# Patient Record
Sex: Male | Born: 1970 | Race: White | Hispanic: No | Marital: Married | Smoking: Never smoker
Health system: Southern US, Community
[De-identification: ages and names within clinical notes are randomized; demographics above are authoritative.]

---

## 2009-03-27 ENCOUNTER — Encounter: Admission: RE | Admit: 2009-03-27 | Discharge: 2009-03-27 | Payer: Self-pay | Admitting: Family Medicine

## 2009-03-28 ENCOUNTER — Encounter: Admission: RE | Admit: 2009-03-28 | Discharge: 2009-03-28 | Payer: Self-pay | Admitting: Family Medicine

## 2009-04-01 ENCOUNTER — Ambulatory Visit (HOSPITAL_COMMUNITY): Admission: RE | Admit: 2009-04-01 | Discharge: 2009-04-01 | Payer: Self-pay | Admitting: Family Medicine

## 2009-05-15 ENCOUNTER — Encounter: Admission: RE | Admit: 2009-05-15 | Discharge: 2009-05-15 | Payer: Self-pay | Admitting: Family Medicine

## 2009-07-17 ENCOUNTER — Ambulatory Visit (HOSPITAL_COMMUNITY): Admission: RE | Admit: 2009-07-17 | Discharge: 2009-07-17 | Payer: Self-pay | Admitting: Surgery

## 2009-07-17 ENCOUNTER — Encounter (INDEPENDENT_AMBULATORY_CARE_PROVIDER_SITE_OTHER): Payer: Self-pay | Admitting: Surgery

## 2010-11-10 LAB — COMPREHENSIVE METABOLIC PANEL
AST: 70 U/L — ABNORMAL HIGH (ref 0–37)
BUN: 13 mg/dL (ref 6–23)
CO2: 31 mEq/L (ref 19–32)
Calcium: 9.4 mg/dL (ref 8.4–10.5)
Chloride: 104 mEq/L (ref 96–112)
GFR calc Af Amer: >60 mL/min (ref >60)
GFR calc non Af Amer: >60 mL/min (ref >60)
Glucose, Bld: 105 mg/dL — ABNORMAL HIGH (ref 70–99)
Sodium: 140 mEq/L (ref 135–145)
Total Bilirubin: 0.9 mg/dL (ref 0.3–1.2)

## 2010-11-10 LAB — CBC
HCT: 44.6 % (ref 39.0–52.0)
Hemoglobin: 14.8 g/dL (ref 13.0–17.0)
MCHC: 33.1 g/dL (ref 30.0–36.0)
MCV: 93.6 fL (ref 78.0–100.0)

## 2010-11-10 LAB — DIFFERENTIAL
Basophils Absolute: 0 10*3/uL (ref 0.0–0.1)
Eosinophils Absolute: 0 10*3/uL (ref 0.0–0.7)
Eosinophils Relative: 1 % (ref 0–5)
Lymphocytes Relative: 28 % (ref 12–46)
Lymphs Abs: 1.3 10*3/uL (ref 0.7–4.0)
Monocytes Absolute: 0.3 10*3/uL (ref 0.1–1.0)
Monocytes Relative: 6 % (ref 3–12)
Neutro Abs: 3.1 10*3/uL (ref 1.7–7.7)

## 2010-11-10 LAB — URINALYSIS, ROUTINE W REFLEX MICROSCOPIC
Hgb urine dipstick: NEGATIVE
Ketones, ur: NEGATIVE mg/dL

## 2010-11-10 LAB — PROTIME-INR
INR: 0.99 (ref 0.00–1.49)
Prothrombin Time: 13 seconds (ref 11.6–15.2)

## 2010-12-01 ENCOUNTER — Other Ambulatory Visit: Payer: Self-pay | Admitting: Gastroenterology

## 2010-12-01 DIAGNOSIS — R1011 Right upper quadrant pain: Secondary | ICD-10-CM

## 2010-12-02 ENCOUNTER — Ambulatory Visit
Admission: RE | Admit: 2010-12-02 | Discharge: 2010-12-02 | Disposition: A | Discharge status: Home / Self Care | Payer: BC Managed Care – PPO | Source: Ambulatory Visit | Attending: Gastroenterology | Admitting: Gastroenterology

## 2010-12-02 DIAGNOSIS — R1011 Right upper quadrant pain: Secondary | ICD-10-CM

## 2010-12-29 ENCOUNTER — Emergency Department (HOSPITAL_BASED_OUTPATIENT_CLINIC_OR_DEPARTMENT_OTHER)
Admission: EM | Admit: 2010-12-29 | Discharge: 2010-12-29 | Disposition: A | Discharge status: Home / Self Care | Payer: BC Managed Care – PPO | Attending: Emergency Medicine | Admitting: Emergency Medicine

## 2010-12-29 DIAGNOSIS — K219 Gastro-esophageal reflux disease without esophagitis: Secondary | ICD-10-CM | POA: Insufficient documentation

## 2010-12-29 DIAGNOSIS — F1910 Other psychoactive substance abuse, uncomplicated: Secondary | ICD-10-CM | POA: Insufficient documentation

## 2010-12-29 DIAGNOSIS — R197 Diarrhea, unspecified: Secondary | ICD-10-CM | POA: Insufficient documentation

## 2010-12-29 DIAGNOSIS — G8929 Other chronic pain: Secondary | ICD-10-CM | POA: Insufficient documentation

## 2010-12-29 DIAGNOSIS — R112 Nausea with vomiting, unspecified: Secondary | ICD-10-CM | POA: Insufficient documentation

## 2010-12-29 LAB — BASIC METABOLIC PANEL
Chloride: 101 mEq/L (ref 96–112)
GFR calc Af Amer: >60 mL/min (ref >60)

## 2011-02-12 ENCOUNTER — Other Ambulatory Visit: Payer: Self-pay | Admitting: Gastroenterology

## 2011-12-16 ENCOUNTER — Other Ambulatory Visit (HOSPITAL_BASED_OUTPATIENT_CLINIC_OR_DEPARTMENT_OTHER): Payer: Self-pay | Admitting: Family Medicine

## 2011-12-16 DIAGNOSIS — M5124 Other intervertebral disc displacement, thoracic region: Secondary | ICD-10-CM

## 2011-12-18 ENCOUNTER — Ambulatory Visit (HOSPITAL_BASED_OUTPATIENT_CLINIC_OR_DEPARTMENT_OTHER)
Admission: RE | Admit: 2011-12-18 | Discharge: 2011-12-18 | Disposition: A | Discharge status: Home / Self Care | Payer: BC Managed Care – PPO | Source: Ambulatory Visit | Attending: Family Medicine | Admitting: Family Medicine

## 2011-12-18 DIAGNOSIS — R109 Unspecified abdominal pain: Secondary | ICD-10-CM

## 2011-12-18 DIAGNOSIS — IMO0002 Reserved for concepts with insufficient information to code with codable children: Secondary | ICD-10-CM | POA: Insufficient documentation

## 2011-12-18 DIAGNOSIS — M5124 Other intervertebral disc displacement, thoracic region: Secondary | ICD-10-CM

## 2012-03-07 ENCOUNTER — Other Ambulatory Visit: Payer: Self-pay | Admitting: Family Medicine

## 2013-01-01 IMAGING — US US ABDOMEN COMPLETE
1 series · 14 of 25 positions shown · non-contrast
Comparison: March 27, 2009 and March 28, 2009

CLINICAL DATA: Right upper quadrant pain

ABDOMINAL ULTRASOUND COMPLETE

[Series 1: us abdomen complete · 0.17mm/px · 14 of 69 slices shown]
[im 1/69]
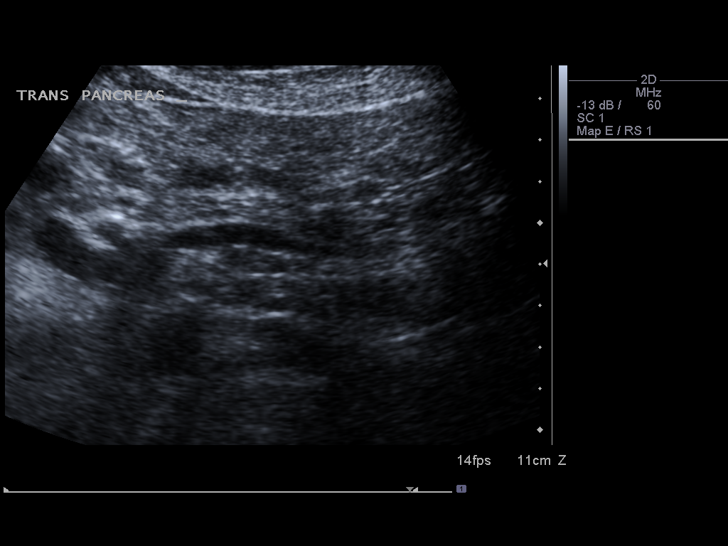
[im 6/69]
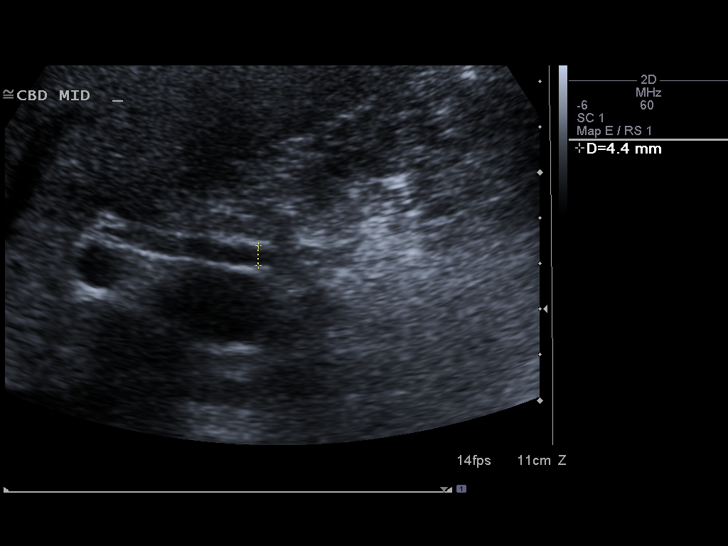
[im 12/69]
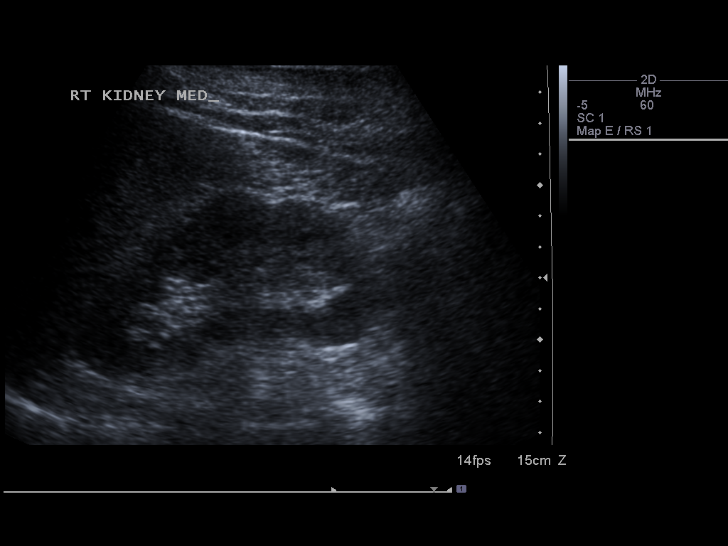
[im 18/69]
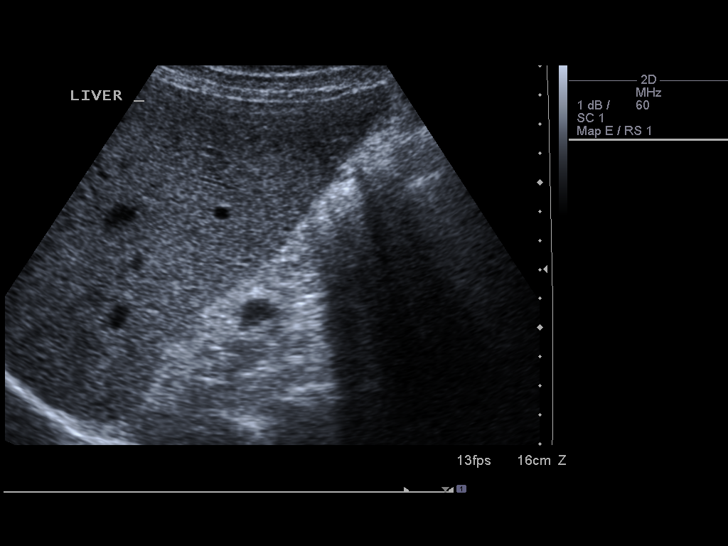
[im 23/69]
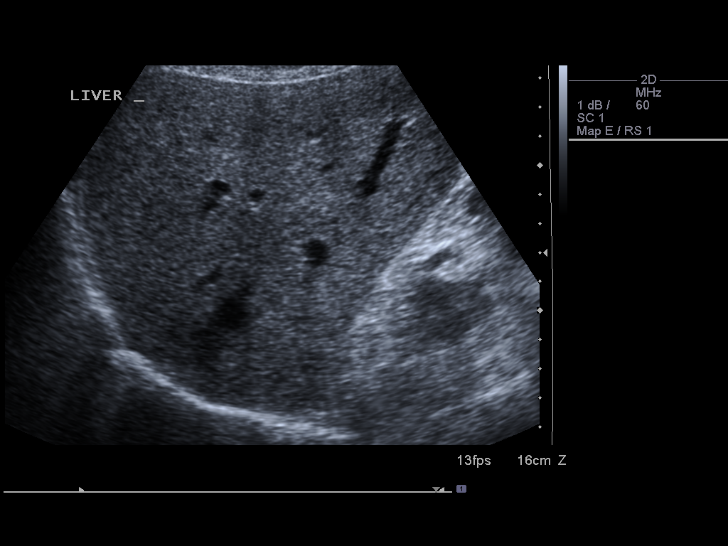
[im 26/69]
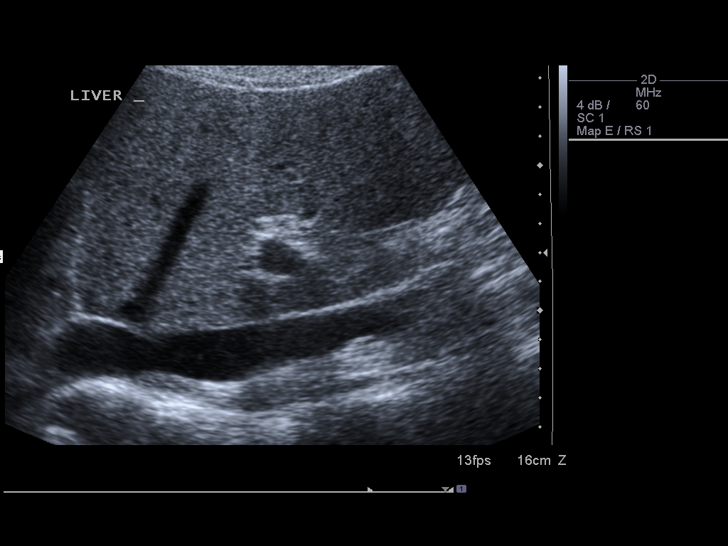
[im 32/69]
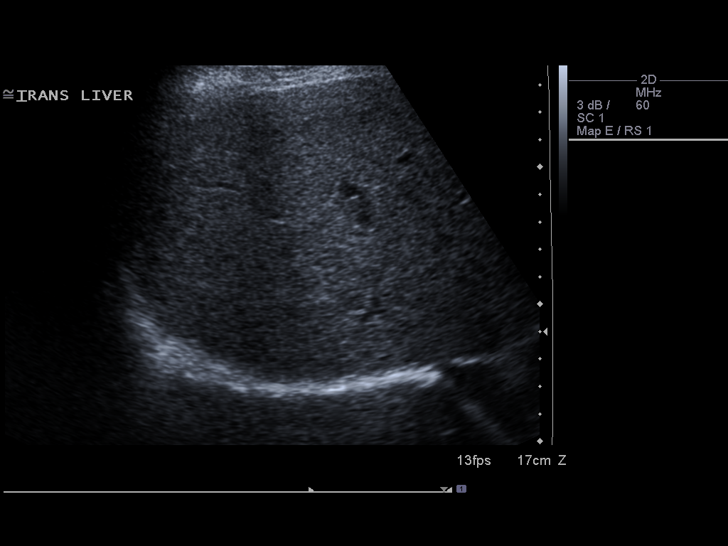
[im 37/69]
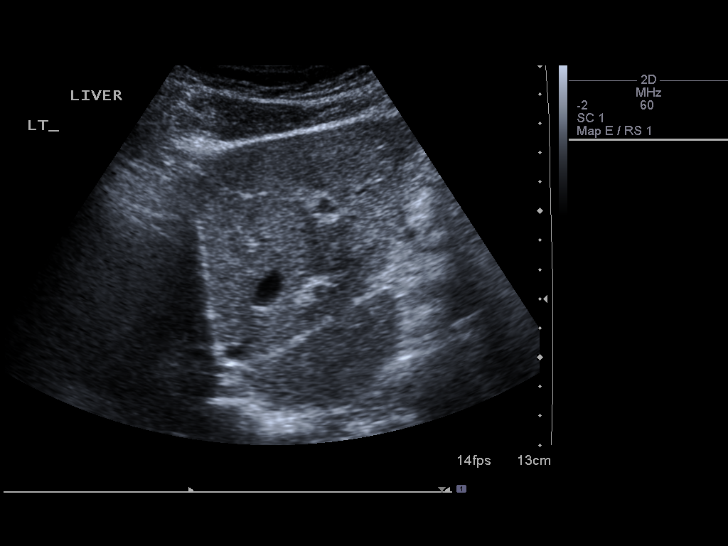
[im 43/69]
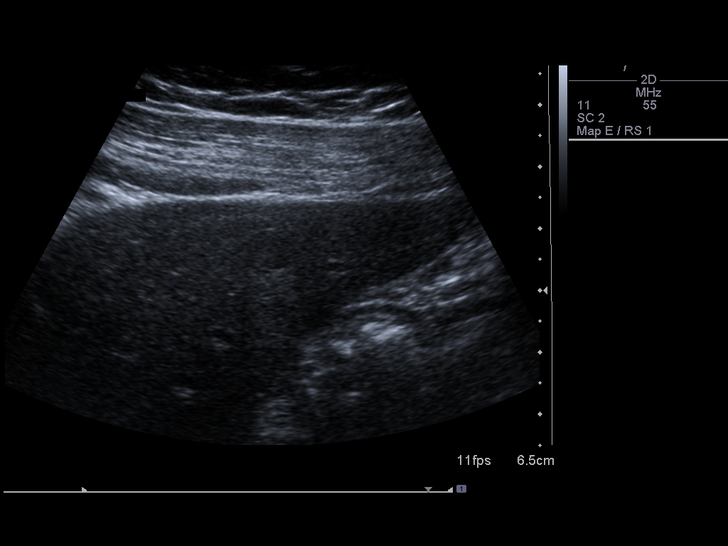
[im 46/69]
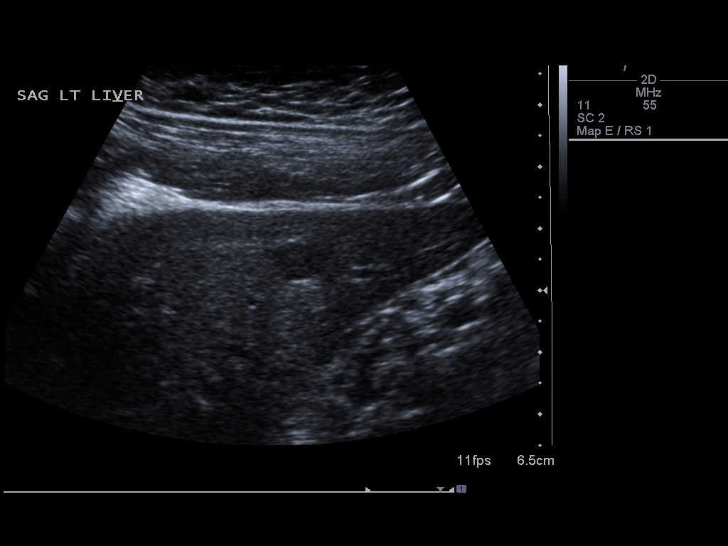
[im 52/69]
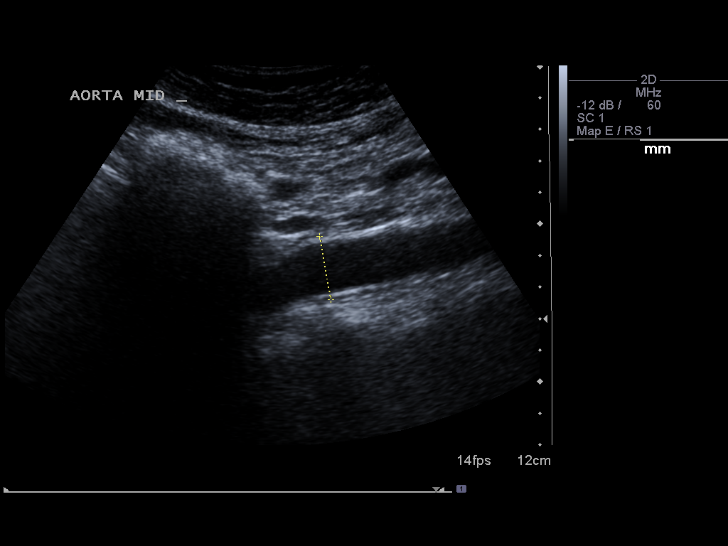
[im 57/69]
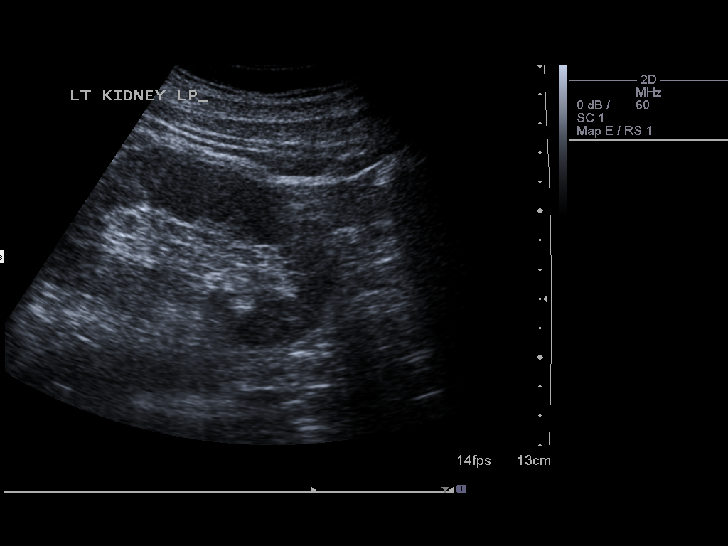
[im 63/69]
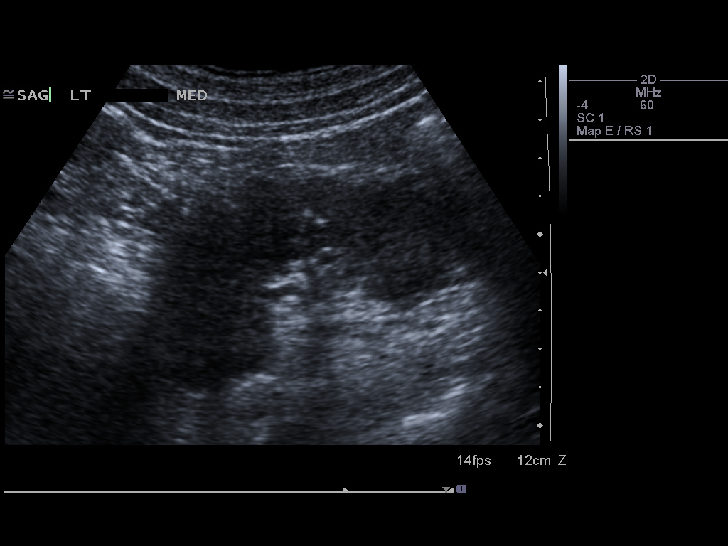
[im 69/69]
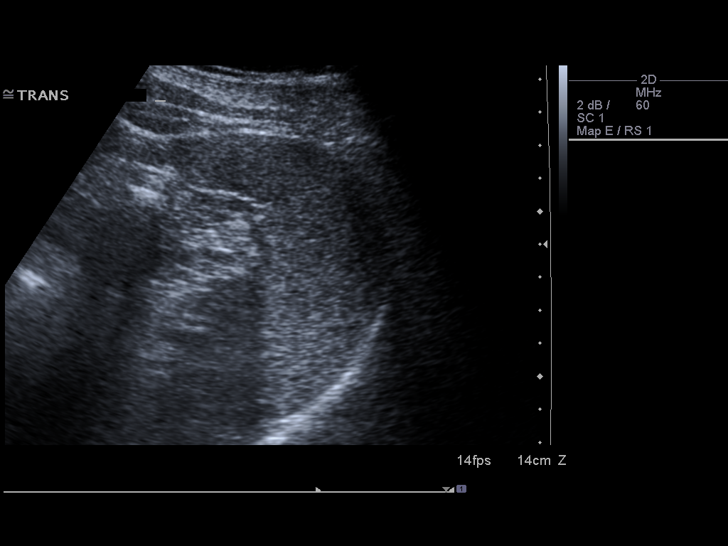

[14 of 25 positions shown; findings below may reference images not displayed]

FINDINGS: Gallbladder:  Surgically absent.

Common Bile Duct:  Within normal limits in caliber. Measures
mm.

Liver: No focal mass lesion identified. There is a stable 7 mm cyst
in the left lobe. Diffuse fatty infiltration is present.

IVC:  Appears normal.

Pancreas:  No abnormality identified.

Spleen:  Within normal limits in size and echotexture.

Right kidney:  Normal in size and parenchymal echogenicity.  No
evidence of mass or hydronephrosis.

Left kidney:  Normal in size and parenchymal echogenicity.  No
evidence of mass or hydronephrosis. There is a stable 1.8 cm
complex cyst at the mid pole.

Abdominal Aorta:  No aneurysm identified.
IMPRESSION: There is mild, diffuse fatty infiltration the liver.

There are stable hepatic and renal cysts.

## 2014-08-16 ENCOUNTER — Other Ambulatory Visit: Payer: Self-pay | Admitting: Gastroenterology

## 2017-10-18 ENCOUNTER — Other Ambulatory Visit: Payer: Self-pay | Admitting: Family

## 2017-10-18 DIAGNOSIS — D582 Other hemoglobinopathies: Secondary | ICD-10-CM

## 2017-10-19 ENCOUNTER — Other Ambulatory Visit: Payer: Self-pay

## 2017-10-19 ENCOUNTER — Inpatient Hospital Stay: Payer: Self-pay

## 2017-10-19 ENCOUNTER — Inpatient Hospital Stay: Payer: Self-pay | Attending: Family | Admitting: Family

## 2017-10-19 ENCOUNTER — Encounter: Payer: Self-pay | Admitting: Family

## 2017-10-19 VITALS — BP 116/71 | HR 81 | Temp 97.9°F | Resp 17 | Wt 223.0 lb

## 2017-10-19 DIAGNOSIS — D582 Other hemoglobinopathies: Secondary | ICD-10-CM | POA: Insufficient documentation

## 2017-10-19 LAB — CBC WITH DIFFERENTIAL (CANCER CENTER ONLY)
Basophils Absolute: 0 10*3/uL (ref 0.0–0.1)
Basophils Relative: 0 %
EOS ABS: 0.1 10*3/uL (ref 0.0–0.5)
EOS PCT: 1 %
HCT: 49.2 % (ref 38.7–49.9)
Hemoglobin: 17 g/dL (ref 13.0–17.1)
LYMPHS ABS: 1.6 10*3/uL (ref 0.9–3.3)
LYMPHS PCT: 25 %
MCH: 30 pg (ref 28.0–33.4)
MCHC: 34.6 g/dL (ref 32.0–35.9)
MCV: 86.8 fL (ref 82.0–98.0)
MONO ABS: 0.5 10*3/uL (ref 0.1–0.9)
MONOS PCT: 9 %
Neutro Abs: 4 10*3/uL (ref 1.5–6.5)
Neutrophils Relative %: 65 %
PLATELETS: 224 10*3/uL (ref 145–400)
RBC: 5.67 MIL/uL (ref 4.20–5.70)
RDW: 14.6 % (ref 11.1–15.7)
WBC Count: 6.3 10*3/uL (ref 4.0–10.0)

## 2017-10-19 LAB — IRON AND TIBC
IRON: 152 ug/dL (ref 42–163)
Saturation Ratios: 52 % (ref 42–163)
TIBC: 293 ug/dL (ref 202–409)
UIBC: 141 ug/dL

## 2017-10-19 LAB — RETICULOCYTES
RBC.: 5.48 MIL/uL (ref 4.20–5.82)
RETIC COUNT ABSOLUTE: 98.6 10*3/uL — AB (ref 34.8–93.9)
Retic Ct Pct: 1.8 % (ref 0.8–1.8)

## 2017-10-19 LAB — CMP (CANCER CENTER ONLY)
ALBUMIN: 3.9 g/dL (ref 3.5–5.0)
ALT: 46 U/L (ref 10–47)
AST: 32 U/L (ref 11–38)
Alkaline Phosphatase: 63 U/L (ref 26–84)
Anion gap: 10 (ref 5–15)
BILIRUBIN TOTAL: 1.4 mg/dL (ref 0.2–1.6)
BUN: 23 mg/dL — ABNORMAL HIGH (ref 7–22)
CHLORIDE: 101 mmol/L (ref 98–108)
CO2: 32 mmol/L (ref 18–33)
CREATININE: 1.5 mg/dL — AB (ref 0.60–1.20)
Calcium: 9.1 mg/dL (ref 8.0–10.3)
GLUCOSE: 120 mg/dL — AB (ref 73–118)
Potassium: 4 mmol/L (ref 3.3–4.7)
Sodium: 143 mmol/L (ref 128–145)
Total Protein: 7.1 g/dL (ref 6.4–8.1)

## 2017-10-19 LAB — FERRITIN: Ferritin: 216 ng/mL (ref 22–316)

## 2017-10-19 LAB — LACTATE DEHYDROGENASE: LDH: 190 U/L (ref 125–245)

## 2017-10-19 NOTE — Progress Notes (Signed)
Hematology/Oncology Consultation   Name: Jeff Harris      MRN: [id number]    Location: Room/bed info not found  Date: 10/25/2017 Time:9:44 AM   REFERRING PHYSICIAN: Thao Le, DO  REASON FOR CONSULT: Elevated hemoglobin    DIAGNOSIS:  Secondary polycythemia - testosterone supplement  HISTORY OF PRESENT ILLNESS: Ms. Hritz is a very pleasant 47 yo caucasian gentleman with an elevated Hgb over the last couple years. He started a testosterone supplement to help with energy 3 years ago. He stopped this 3 weeks ago due to elevated Hgb.  He is symptomatic with fatigue and a ruddy complexion.  He had been advised to go to the [facility] and donate to prevent elevated Hgb but has not donated yet.  He has severe GERD and history of Barrett's esophagus that he states has resolved. Her was unable to tolerate enteric coated aspirin due to GI upset.  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  He had an abdominal US last week with pain management. He was given our fax number and will have this sent to [place].  He has had no issue with infections. No fever, chills, n/v, cough, rash, dizziness, SOB, chest pain, palpitations, abdominal pain or changes in bowel or bladder habits.  No swelling, tenderness, numbness or tingling in his extremities.  No personal cancer history. His father passed away within a week of being diagnosed with AML. His grandfather was an alcoholic and had history of cirrhosis and pancreatic cancer.  He is not a smoker and does not drink alcoholic beverages.  He has maintained a good appetite and is staying hydrated. His weight is stable.  He stays quite active working out 4-5 day a week.   ROS: All other 10 point review of systems is negative.   PAST MEDICAL HISTORY:   No past medical history on file.  ALLERGIES: Allergies  Allergen Reactions  . Beeswax Anaphylaxis and Swelling    HONEY BEES      MEDICATIONS:  Current  Outpatient Medications on File Prior to Visit  Medication Sig Dispense Refill  . esomeprazole (NEXIUM) 40 MG capsule Take 40 mg by mouth daily at 12 noon.    . famciclovir (FAMVIR) 250 MG tablet Take 250 mg by mouth daily.    . fluticasone (FLONASE) 50 MCG/ACT nasal spray Place 2 sprays into both nostrils daily.    Marland Kitchen gabapentin (NEURONTIN) 300 MG capsule Take 300 mg by mouth daily.    Marland Kitchen lisinopril (PRINIVIL,ZESTRIL) 40 MG tablet Take 40 mg by mouth daily.    . Oxycodone HCl 10 MG TABS Take 10 mg by mouth every 4 (four) hours as needed.     No current facility-administered medications on file prior to visit.      PAST SURGICAL HISTORY No past surgical history on file.  FAMILY HISTORY: No family history on file.  SOCIAL HISTORY:  reports that  has never smoked. he has never used smokeless tobacco. His alcohol and drug histories are not on file.  PERFORMANCE STATUS: The patient's performance status is 1 - Symptomatic but completely ambulatory  PHYSICAL EXAM: Most Recent Vital Signs: Blood pressure 116/71, pulse 81, temperature 97.9 F (36.6 C), temperature source Oral, resp. rate 17, weight 223 lb (101.2 kg), SpO2 100 %. BP 116/71 (BP Location: Right Arm, Patient Position: Sitting)   Pulse 81   Temp 97.9 F (36.6 C) (Oral)   Resp 17   Wt 223 lb (  101.2 kg)   SpO2 100%   General Appearance:    Alert, cooperative, no distress, appears stated age  Head:    Normocephalic, without obvious abnormality, atraumatic  Eyes:    PERRL, conjunctiva/corneas clear, EOM's intact, fundi    benign, both eyes             Throat:   Lips, mucosa, and tongue normal; teeth and gums normal  Neck:   Supple, symmetrical, trachea midline, no adenopathy;       thyroid:  No enlargement/tenderness/nodules; no carotid   bruit or JVD  Back:     Symmetric, no curvature, ROM normal, no CVA tenderness  Lungs:     Clear to auscultation bilaterally, respirations unlabored  Chest wall:    No tenderness or  deformity  Heart:    Regular rate and rhythm, S1 and S2 normal, no murmur, rub   or gallop  Abdomen:     Soft, non-tender, bowel sounds active all four quadrants,    no masses, no organomegaly        Extremities:   Extremities normal, atraumatic, no cyanosis or edema  Pulses:   2+ and symmetric all extremities  Skin:   Skin color, texture, turgor normal, no rashes or lesions  Lymph nodes:   Cervical, supraclavicular, and axillary nodes normal  Neurologic:   CNII-XII intact. Normal strength, sensation and reflexes      throughout    LABORATORY DATA:  No results found for this or any previous visit (from the past 48 hour(s)).    RADIOGRAPHY: No results found.     PATHOLOGY: None  ASSESSMENT/PLAN:  Ms. Weisenberger is a very pleasant 47 yo caucasian gentleman with an elevated Hgb over the last couple years possibly due to testosterone injections. He is symptomatic with fatigue and has a ruddy complexion.  Hct today is 49.2. Iron studies are stable. JAK-2 is pending.  If he is JAK-2 negative we will have him start donating with [facility] 3 months.  We will wait for the abdominal US report to be faxed over.  Once we have his lab and [place] report we will schedule a follow-up if needed.   All questions were answered and he is in agreement with the plan. He will contact our office with any questions or concerns. We can certainly see him sooner if necessary.  He was discussed with and also seen by Dr. Myna Hidalgo and he is in agreement with the aforementioned.   Emeline Gins     Addendum: I saw and examined the patient with Maralyn Sago.  I agree with the above assessment.  I have to believe that this is going to be a secondary polycythemia.  I suspect he probably is from his testosterone supplementation.  His iron studies show ferritin of 216 with an iron saturation of 52%.  As such, he is not iron deficient which I would expect with polycythemia vera.  His BUN and creatinine are up a  little bit.  His LDH is normal at 190.  The only possible suggestion of a primary polycythemia would be his erythropoietin level.  It is only 1.8.  This is quite low.  We will see what his JAK2 assay shows.  I think this will definitely be needed to firmly rule out polycythemia vera.  Because he is blood type AB+, I told him to donate to the [facility].  He would be an excellent [facility] blood donate her.  Given his blood type, his blood  can be used greatly.  We spent about 45 minutes with he and his wife.  They are both very nice.  We spent over 50% of the time face-to-face with them.  We answered their questions.  We went over the lab work that we had.  We will make plans for follow-up depending on what we find with the JAK2 assay.  Christin BachPete Ennever, MD

## 2017-10-20 LAB — ERYTHROPOIETIN: Erythropoietin: 1.8 m[IU]/mL — ABNORMAL LOW (ref 2.6–18.5)

## 2017-10-31 ENCOUNTER — Other Ambulatory Visit: Payer: Self-pay | Admitting: Family

## 2017-11-04 ENCOUNTER — Telehealth: Payer: Self-pay | Admitting: Family

## 2017-11-04 NOTE — Telephone Encounter (Signed)
No answer. Left message with call back number to discuss labs and treatment.

## 2017-11-08 ENCOUNTER — Encounter: Payer: Self-pay | Admitting: Hematology & Oncology

## 2017-11-08 LAB — JAK 2 V617F (GENPATH)

## 2017-11-18 ENCOUNTER — Telehealth: Payer: Self-pay

## 2017-11-18 ENCOUNTER — Telehealth: Payer: Self-pay | Admitting: Family

## 2017-11-18 NOTE — Telephone Encounter (Signed)
Received VM from patient confirming he received Maralyn SagoSarah, NP previous message from today and that he will make an appt to give blood with the [facility]. dph

## 2017-11-18 NOTE — Telephone Encounter (Signed)
Left message with call back number letting him know to go ahead and donate with the [facility] and we will see him in another couple months.

## 2017-12-06 ENCOUNTER — Encounter: Payer: Self-pay | Admitting: Hematology & Oncology

## 2018-02-02 ENCOUNTER — Other Ambulatory Visit: Payer: Self-pay | Admitting: Family

## 2018-02-02 DIAGNOSIS — D751 Secondary polycythemia: Secondary | ICD-10-CM

## 2018-02-03 ENCOUNTER — Inpatient Hospital Stay: Payer: Self-pay | Attending: Family | Admitting: Family

## 2018-02-03 ENCOUNTER — Inpatient Hospital Stay: Payer: Self-pay

## 2020-09-24 ENCOUNTER — Other Ambulatory Visit: Payer: Self-pay | Admitting: Family Medicine

## 2020-09-24 DIAGNOSIS — I824Z2 Acute embolism and thrombosis of unspecified deep veins of left distal lower extremity: Secondary | ICD-10-CM

## 2020-09-24 DIAGNOSIS — M79672 Pain in left foot: Secondary | ICD-10-CM

## 2020-09-26 ENCOUNTER — Ambulatory Visit
Admission: RE | Admit: 2020-09-26 | Discharge: 2020-09-26 | Disposition: A | Discharge status: Home / Self Care | Payer: No Typology Code available for payment source | Source: Ambulatory Visit | Attending: Family Medicine | Admitting: Family Medicine

## 2020-09-26 DIAGNOSIS — M79672 Pain in left foot: Secondary | ICD-10-CM

## 2022-02-10 ENCOUNTER — Other Ambulatory Visit: Payer: Self-pay | Admitting: Urology

## 2022-02-10 DIAGNOSIS — R972 Elevated prostate specific antigen [PSA]: Secondary | ICD-10-CM

## 2022-02-25 ENCOUNTER — Ambulatory Visit
Admission: RE | Admit: 2022-02-25 | Discharge: 2022-02-25 | Disposition: A | Discharge status: Home / Self Care | Payer: No Typology Code available for payment source | Source: Ambulatory Visit | Attending: Urology | Admitting: Urology

## 2022-02-25 DIAGNOSIS — R972 Elevated prostate specific antigen [PSA]: Secondary | ICD-10-CM

## 2022-03-15 ENCOUNTER — Ambulatory Visit
Admission: RE | Admit: 2022-03-15 | Discharge: 2022-03-15 | Disposition: A | Discharge status: Home / Self Care | Payer: No Typology Code available for payment source | Source: Ambulatory Visit | Attending: Urology | Admitting: Urology

## 2022-03-15 MED ORDER — GADOBENATE DIMEGLUMINE 529 MG/ML IV SOLN
20.0000 mL | Freq: Once | INTRAVENOUS | Status: AC | PRN
  Administered 2022-03-15: 20 mL via INTRAVENOUS

## 2022-10-27 IMAGING — US US EXTREM LOW VENOUS*L*
1 series · 13 of 24 positions shown · non-contrast
Comparison: None.

CLINICAL DATA: 49-year-old with swelling and erythema in the left
ankle and foot.



[Series 1: us extrem low venous*left* · 0.07mm/px · 13 of 36 slices shown]
[im 1/36]
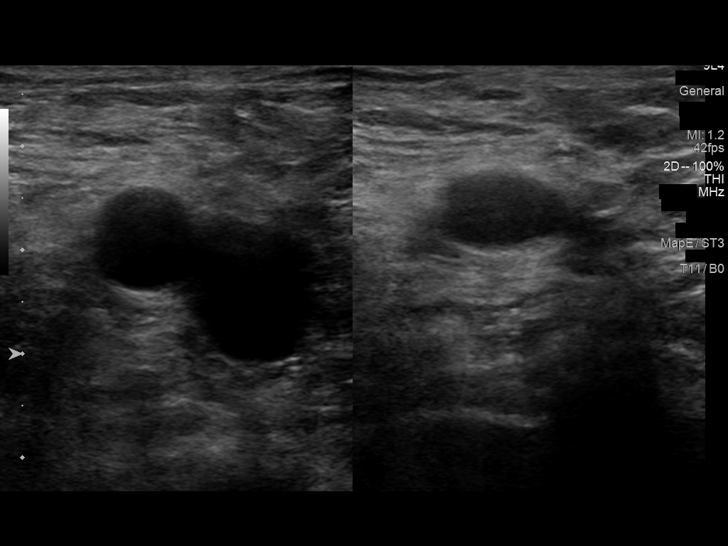
[im 4/36]
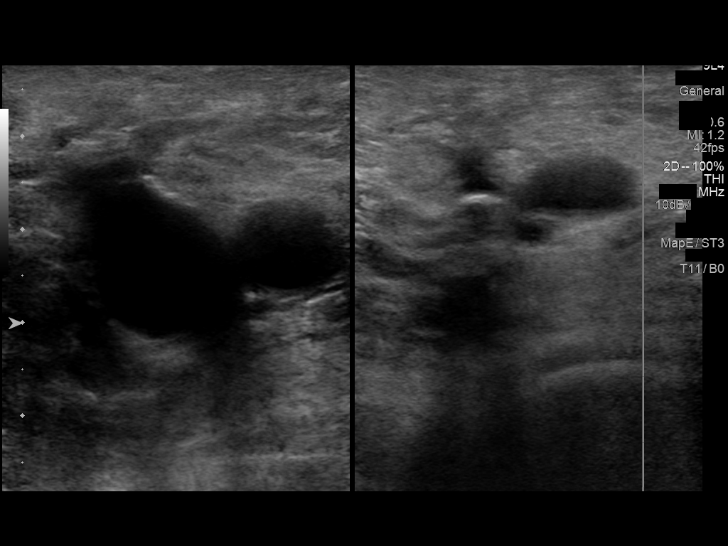
[im 7/36]
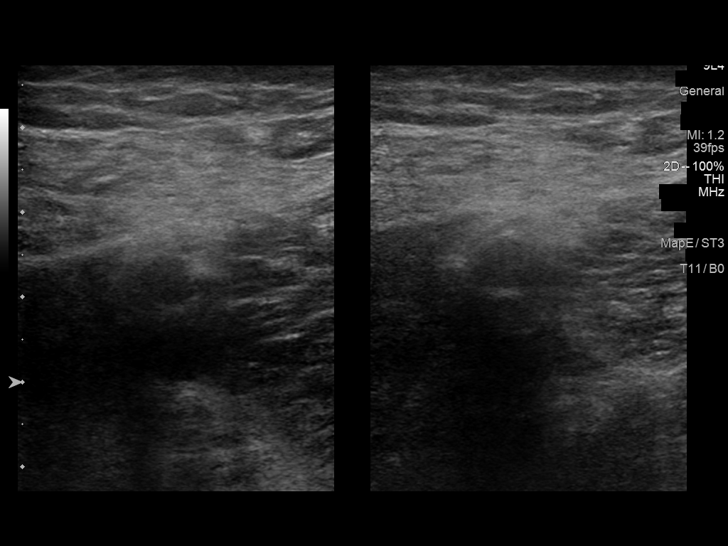
[im 10/36]
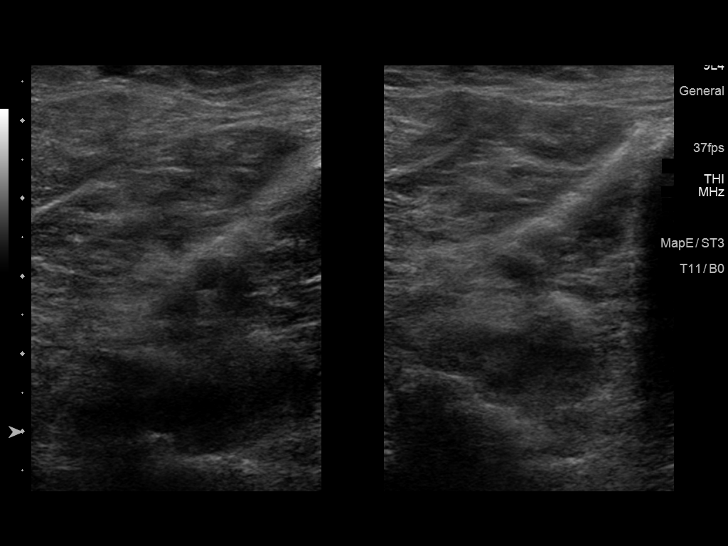
[im 13/36]
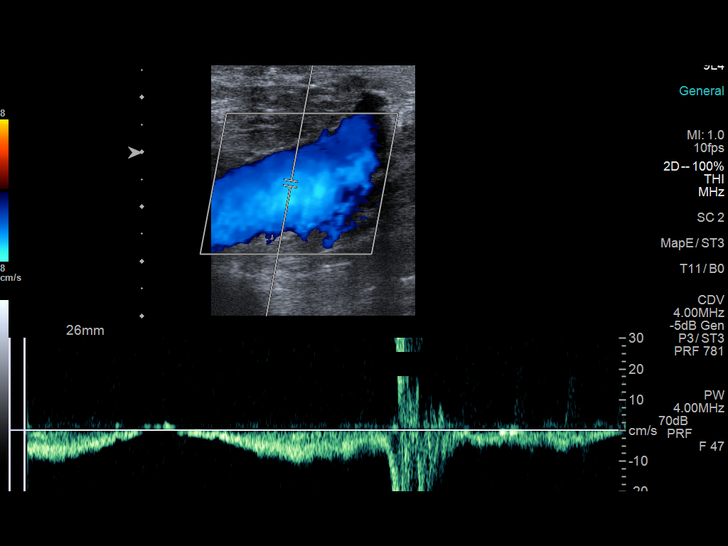
[im 16/36]
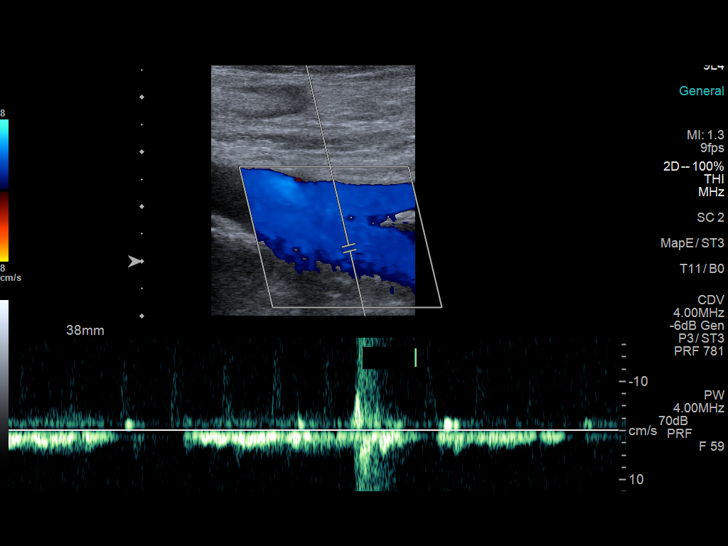
[im 19/36]
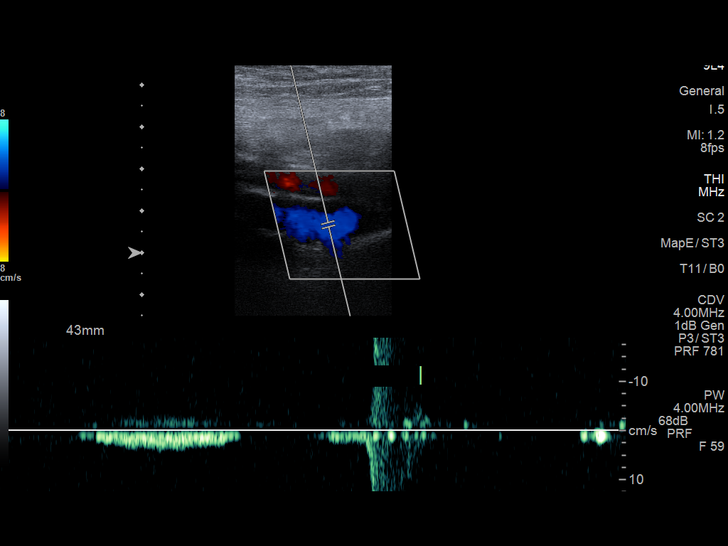
[im 20/36]
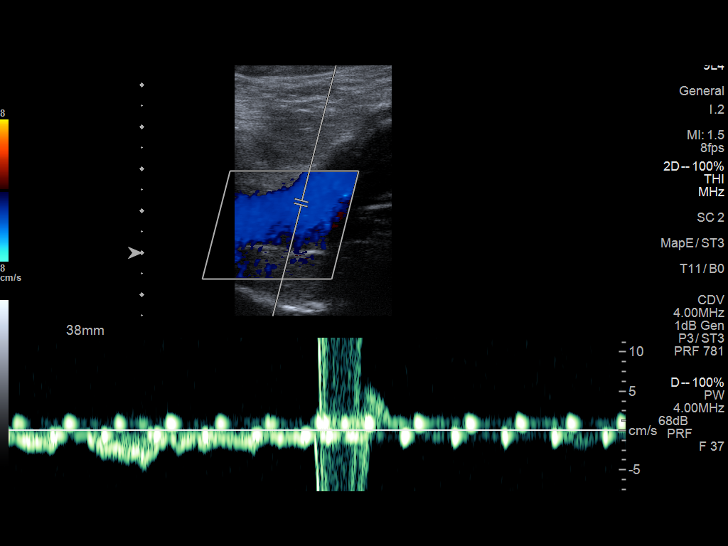
[im 23/36]
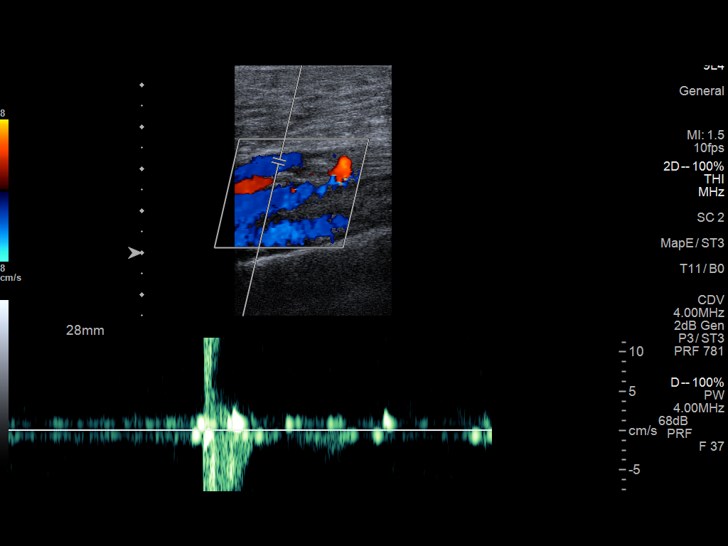
[im 26/36]
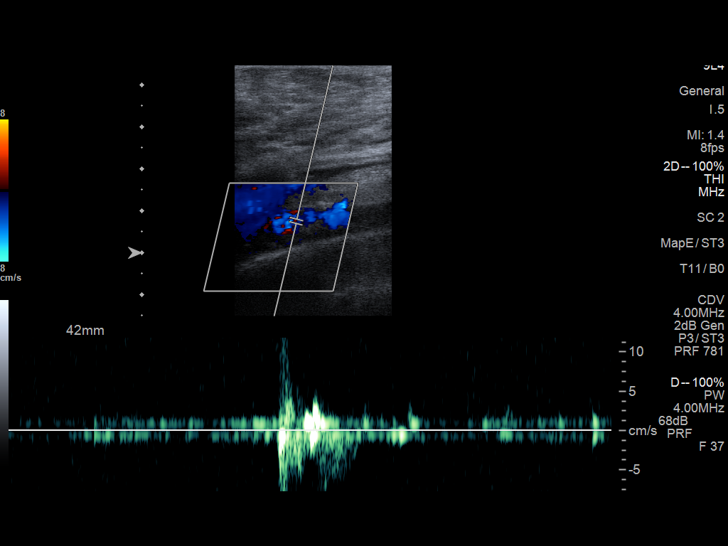
[im 29/36]
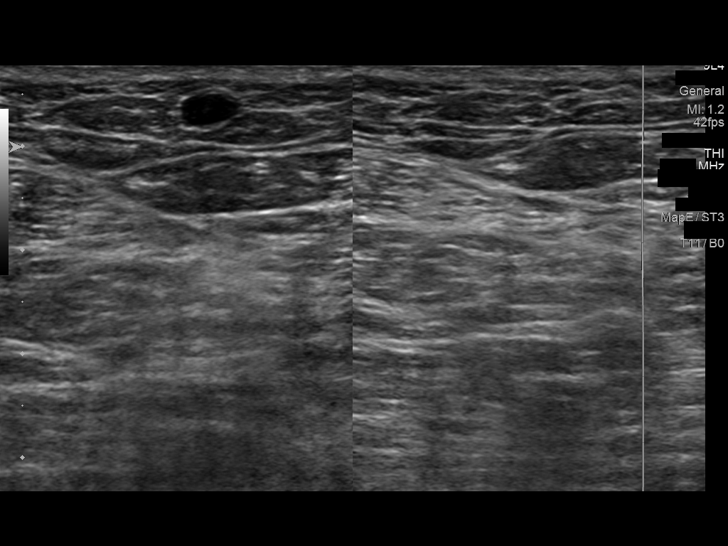
[im 32/36]
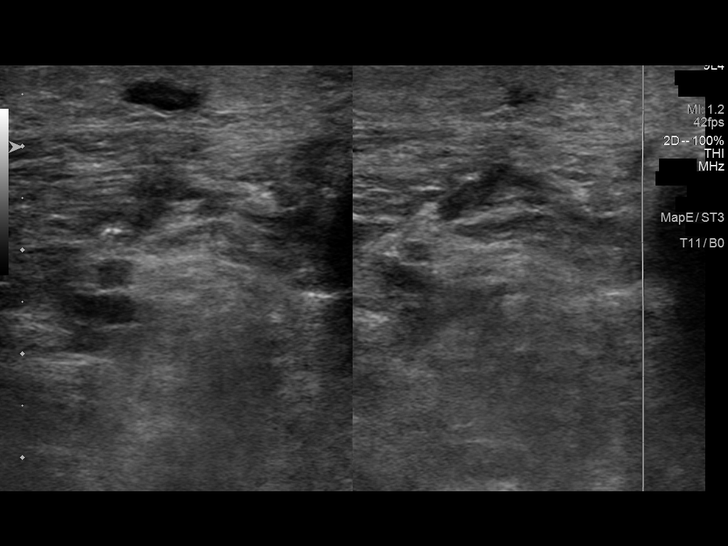
[im 36/36]
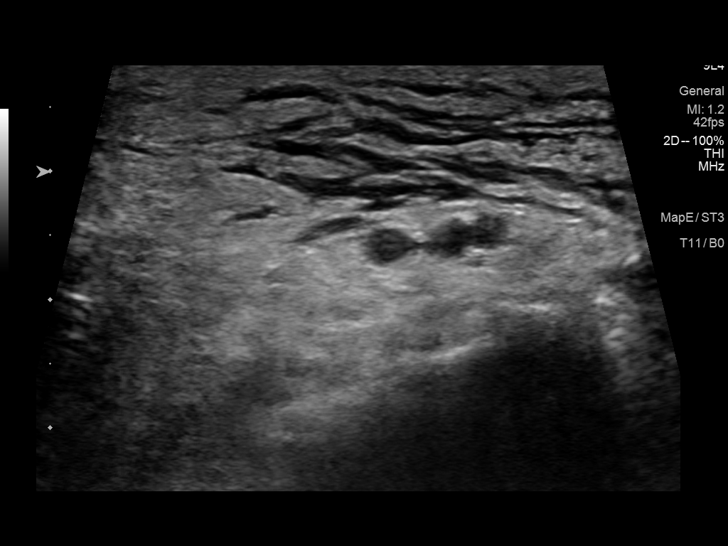

[13 of 24 positions shown; findings below may reference images not displayed]

FINDINGS: Contralateral Common Femoral Vein: Respiratory phasicity is normal
and symmetric with the symptomatic side. No evidence of thrombus.
Normal compressibility.

Common Femoral Vein: No evidence of thrombus. Normal
compressibility, respiratory phasicity and response to augmentation.

Saphenofemoral Junction: No evidence of thrombus. Normal
compressibility and flow on color Doppler imaging.

Profunda Femoral Vein: No evidence of thrombus. Normal
compressibility and flow on color Doppler imaging.

Femoral Vein: No evidence of thrombus. Normal compressibility,
respiratory phasicity and response to augmentation.

Popliteal Vein: No evidence of thrombus. Normal compressibility,
respiratory phasicity and response to augmentation.

Calf Veins: No evidence of thrombus. Normal compressibility and flow
on color Doppler imaging.

Superficial Great Saphenous Vein: No evidence of thrombus. Normal
compressibility.

Other Findings:  Subcutaneous edema at the left ankle.
IMPRESSION: Negative for deep venous thrombosis in left lower extremity.
